# Patient Record
Sex: Male | Born: 1975 | Race: White | Hispanic: No | Marital: Married | Smoking: Never smoker
Health system: Southern US, Community
[De-identification: ages and names within clinical notes are randomized; demographics above are authoritative.]

---

## 2007-09-08 ENCOUNTER — Ambulatory Visit: Payer: Self-pay | Admitting: Unknown Physician Specialty

## 2011-07-04 ENCOUNTER — Emergency Department: Payer: Self-pay | Admitting: Unknown Physician Specialty

## 2012-07-06 ENCOUNTER — Ambulatory Visit: Payer: Self-pay | Admitting: Internal Medicine

## 2014-06-26 IMAGING — CR DG SHOULDER 3+V*L*
1 series · 3 of 3 positions shown · non-contrast
Comparison: none

REASON FOR EXAM: shoulder pain
COMMENTS:

[Series 1: internal rotate · 0.17mm/px · 3 of 3 slices shown]
[im 1/3]
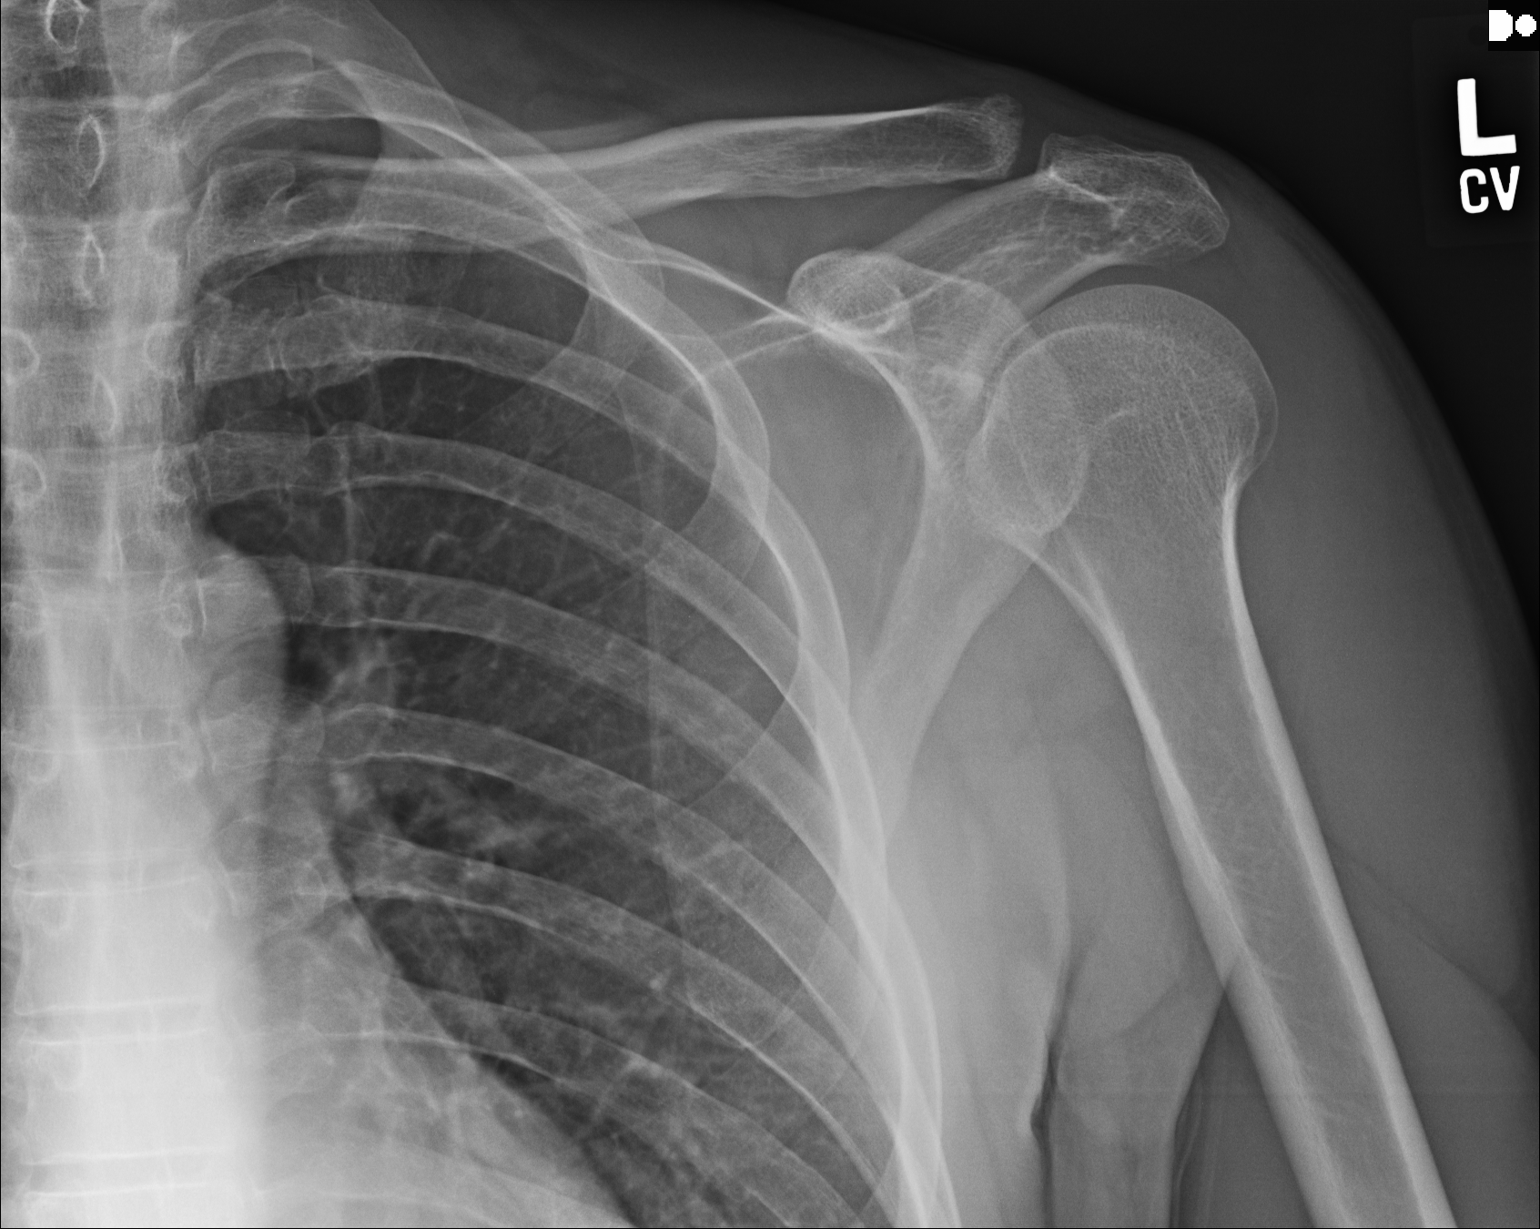
[im 2/3]
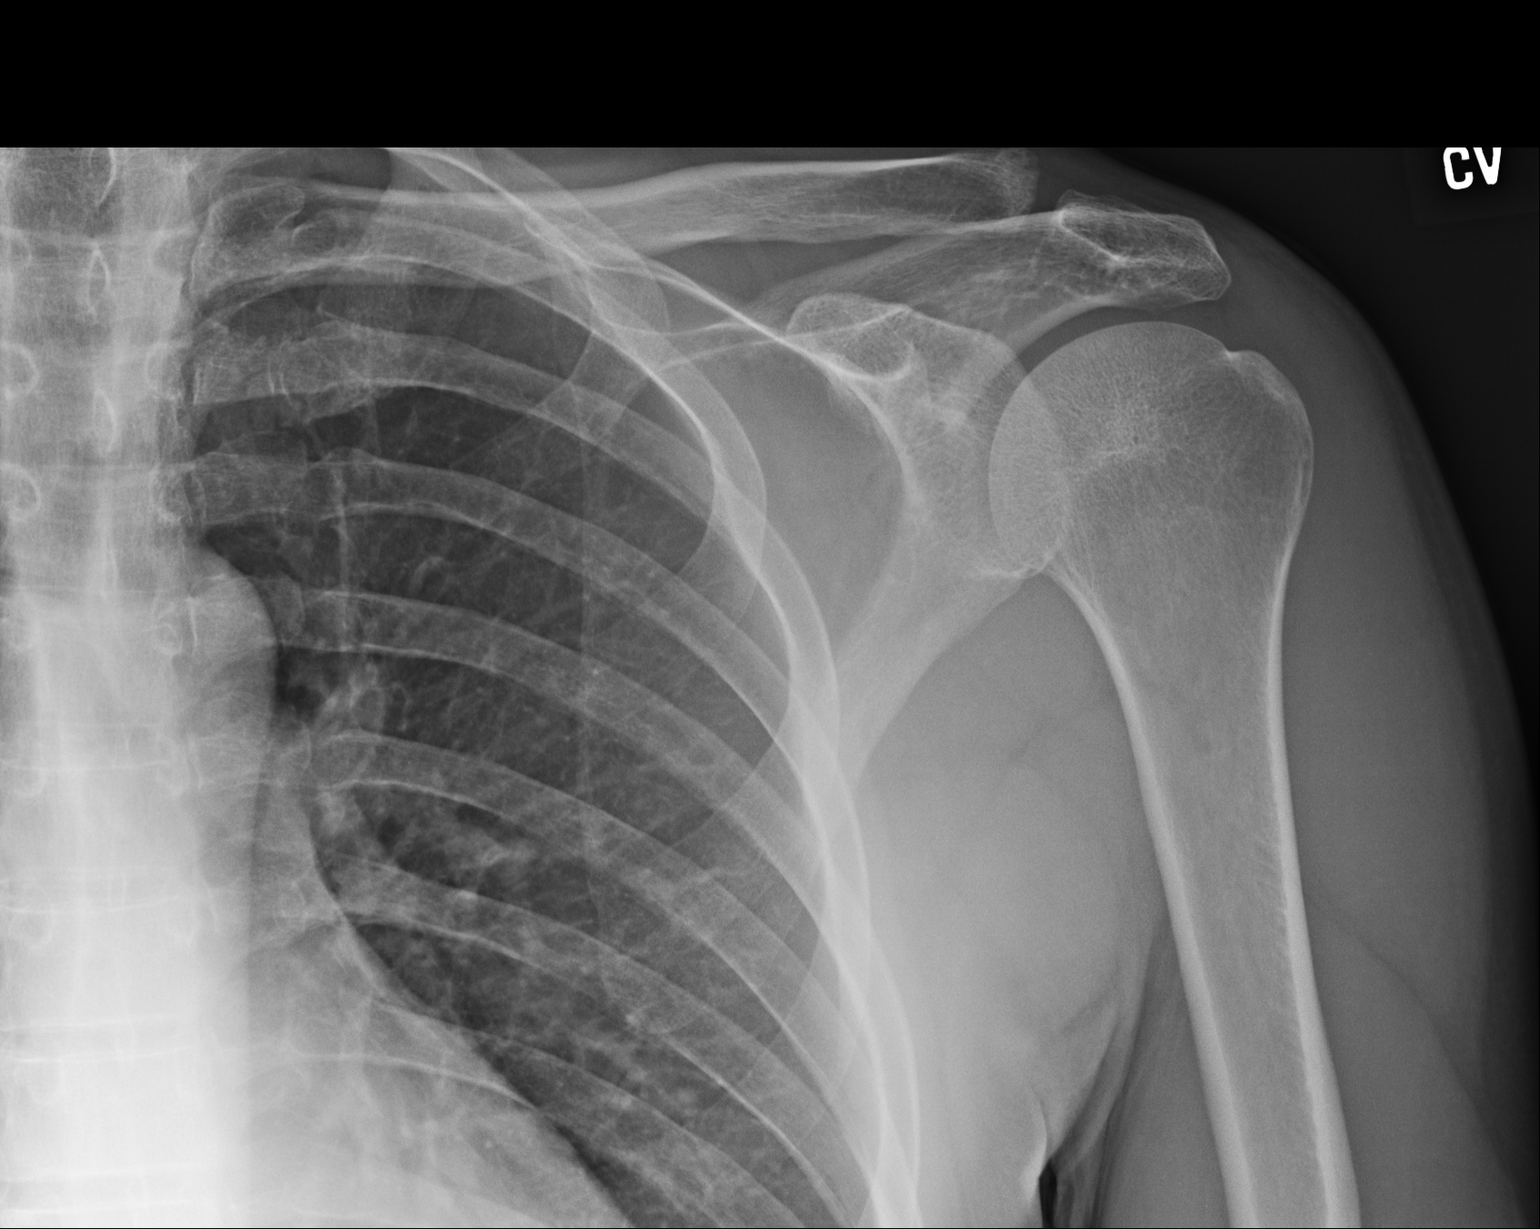
[im 3/3]
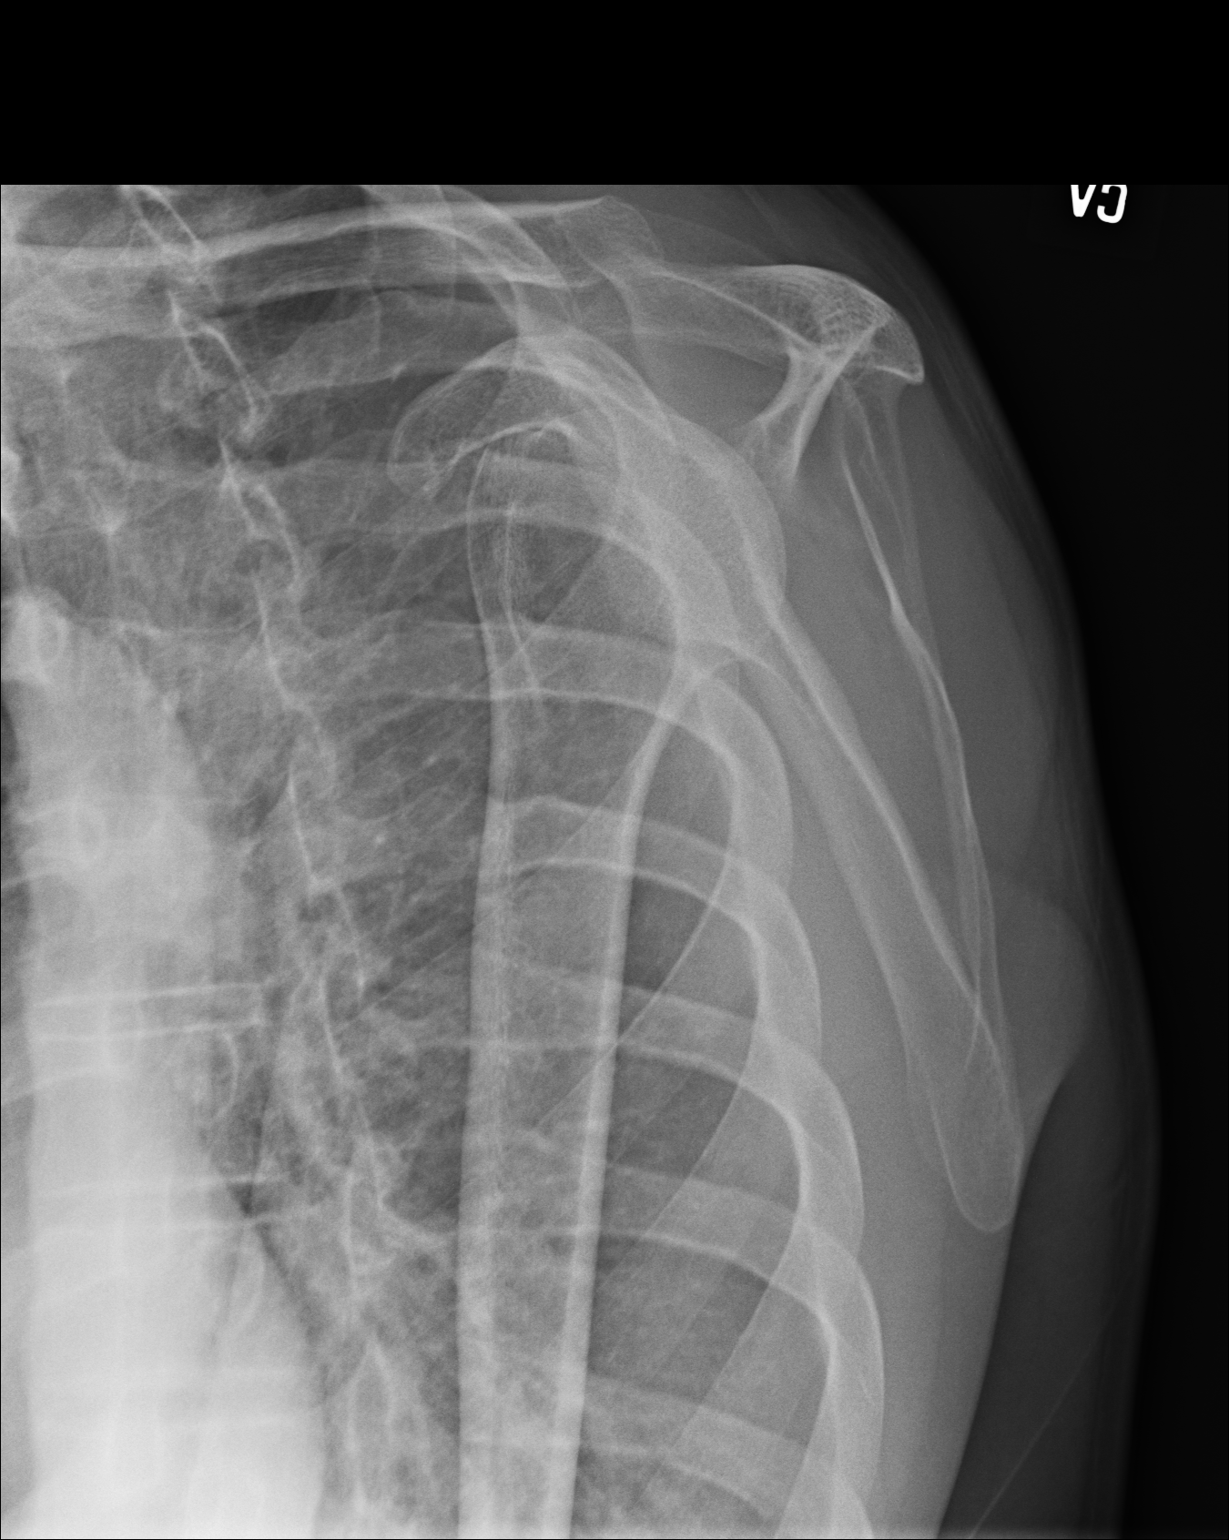

[3 of 3 positions shown; findings below may reference images not displayed]

PROCEDURE:     KDR - KDXR SHOULDER LEFT COMPLETE  - July 06, 2012 [DATE]

RESULT:     Three views of the left shoulder reveal no evidence of an acute
fracture. There are mild degenerative changes of the AC joint. The
glenohumeral joint is grossly normal. The observed portions of the left ribs
appear normal.
IMPRESSION: There is mild degenerative change of the AC joint.
Otherwise the appearance of the shoulders is within the limits of normal.
Further evaluation with MRI may be useful if rotator cuff pathology is
suspected.

[REDACTED]

## 2019-12-14 ENCOUNTER — Other Ambulatory Visit: Payer: Self-pay

## 2019-12-14 ENCOUNTER — Emergency Department
Admission: EM | Admit: 2019-12-14 | Discharge: 2019-12-14 | Disposition: A | Discharge status: Home / Self Care | Payer: BC Managed Care – PPO | Attending: Emergency Medicine | Admitting: Emergency Medicine

## 2019-12-14 DIAGNOSIS — Z23 Encounter for immunization: Secondary | ICD-10-CM | POA: Insufficient documentation

## 2019-12-14 DIAGNOSIS — Y9389 Activity, other specified: Secondary | ICD-10-CM | POA: Diagnosis not present

## 2019-12-14 DIAGNOSIS — Y929 Unspecified place or not applicable: Secondary | ICD-10-CM | POA: Insufficient documentation

## 2019-12-14 DIAGNOSIS — S81811A Laceration without foreign body, right lower leg, initial encounter: Secondary | ICD-10-CM | POA: Insufficient documentation

## 2019-12-14 DIAGNOSIS — W270XXA Contact with workbench tool, initial encounter: Secondary | ICD-10-CM | POA: Insufficient documentation

## 2019-12-14 DIAGNOSIS — Y999 Unspecified external cause status: Secondary | ICD-10-CM | POA: Diagnosis not present

## 2019-12-14 MED ORDER — TETANUS-DIPHTH-ACELL PERTUSSIS 5-2.5-18.5 LF-MCG/0.5 IM SUSP
0.5000 mL | Freq: Once | INTRAMUSCULAR | Status: AC
Start: 2019-12-14 — End: 2019-12-14
  Administered 2019-12-14: 0.5 mL via INTRAMUSCULAR
  Filled 2019-12-14: qty 0.5

## 2019-12-14 MED ORDER — TRAMADOL HCL 50 MG PO TABS: 50.0000 mg | ORAL_TABLET | Freq: Four times a day (QID) | ORAL | 0 refills | Status: AC | PRN

## 2019-12-14 MED ORDER — LIDOCAINE HCL (PF) 1 % IJ SOLN
5.0000 mL | Freq: Once | INTRAMUSCULAR | Status: AC
  Administered 2019-12-14: 5 mL
  Filled 2019-12-14: qty 5

## 2019-12-14 MED ORDER — BACITRACIN-NEOMYCIN-POLYMYXIN 400-5-5000 EX OINT
TOPICAL_OINTMENT | Freq: Once | CUTANEOUS | Status: AC
  Administered 2019-12-14: 1 via TOPICAL
  Filled 2019-12-14: qty 1

## 2019-12-14 MED ORDER — SULFAMETHOXAZOLE-TRIMETHOPRIM 400-80 MG PO TABS: 2.0000 | ORAL_TABLET | Freq: Two times a day (BID) | ORAL | 0 refills | Status: AC

## 2019-12-14 NOTE — ED Provider Notes (Signed)
[facility] Emergency Department Provider Note   ____________________________________________   First MD Initiated Contact with Patient 12/14/19 1843     (approximate)  I have reviewed the triage vital signs and the nursing notes.   HISTORY  Chief Complaint Extremity Laceration    HPI Craig Logan is a 43 y.o. male patient presents with right lower leg laceration.  Patient that he was cutting wood with an ax and slipped hitting the right lower lateral leg.  Patient said bleeding was controlled with direct pressure.  Patient ENT was called and a bandage to the area but he decided to drive by POV to the ED.  Patient denies loss sensation or loss of function.  Patient states tetanus shot not up-to-date.  Patient rates pain as 8/10.  Patient described the pain is "achy".         History reviewed. No pertinent past medical history.  There are no problems to display for this patient.   History reviewed. No pertinent surgical history.  Prior to Admission medications   Medication Sig Start Date End Date Taking? Authorizing Provider  sulfamethoxazole-trimethoprim (BACTRIM) 400-80 MG tablet Take 2 tablets by mouth 2 (two) times daily. 12/14/19   Joni Reining, PA-C  traMADol (ULTRAM) 50 MG tablet Take 1 tablet (50 mg total) by mouth every 6 (six) hours as needed. 12/14/19 12/13/20  Joni Reining, PA-C    Allergies Patient has no known allergies.  No family history on file.  Social History Social History   Tobacco Use  . Smoking status: Never Smoker  . Smokeless tobacco: Never Used  Substance Use Topics  . Alcohol use: Never  . Drug use: Never    Review of Systems Constitutional: No fever/chills Eyes: No visual changes. ENT: No sore throat. Cardiovascular: Denies chest pain. Respiratory: Denies shortness of breath. Gastrointestinal: No abdominal pain.  No nausea, no vomiting.  No diarrhea.  No constipation. Genitourinary: Negative for  dysuria. Musculoskeletal: Negative for back pain. Skin: Negative for rash.  Laceration right lower leg Neurological: Negative for headaches, focal weakness or numbness.   ____________________________________________   PHYSICAL EXAM:  VITAL SIGNS: ED Triage Vitals  Enc Vitals Group     BP 12/14/19 1816 119/70     Pulse Rate 12/14/19 1816 79     Resp 12/14/19 1816 18     Temp 12/14/19 1816 98.6 F (37 C)     Temp Source 12/14/19 1816 Oral     SpO2 12/14/19 1816 97 %     Weight 12/14/19 1814 155 lb (70.3 kg)     Height 12/14/19 1814 5\' 8"  (1.727 m)     Head Circumference --      Peak Flow --      Pain Score 12/14/19 1813 8     Pain Loc --      Pain Edu? --      Excl. in GC? --    Constitutional: Alert and oriented. Well appearing and in no acute distress. Cardiovascular: Normal rate, regular rhythm. Grossly normal heart sounds.  Good peripheral circulation. Respiratory: Normal respiratory effort.  No retractions. Lungs CTAB. Skin: 5 cm laceration right lower leg Psychiatric: Mood and affect are normal. Speech and behavior are normal.  ____________________________________________   LABS (all labs ordered are listed, but only abnormal results are displayed)  Labs Reviewed - No data to display ____________________________________________  EKG   ____________________________________________  RADIOLOGY  ED MD interpretation:    Official radiology report(s): No results found.  ____________________________________________   PROCEDURES  Procedure(s) performed (including Critical Care):  Marland KitchenMarland KitchenLaceration Repair  Date/Time: 12/14/2019 7:25 PM Performed by: Sable Feil, PA-C Authorized by: Sable Feil, PA-C   Consent:    Consent obtained:  Verbal   Consent given by:  Patient   Risks discussed:  Infection, pain, need for additional repair, poor cosmetic result and poor wound healing Anesthesia (see MAR for exact dosages):    Anesthesia method:  Local  infiltration   Local anesthetic:  Lidocaine 1% w/o epi Laceration details:    Location:  Leg   Leg location:  R lower leg   Length (cm):  5   Depth (mm):  2 Repair type:    Repair type:  Simple Pre-procedure details:    Preparation:  Patient was prepped and draped in usual sterile fashion Exploration:    Contaminated: no   Treatment:    Area cleansed with:  Betadine and saline   Amount of cleaning:  Standard   Irrigation method:  Syringe   Visualized foreign bodies/material removed: yes   Skin repair:    Repair method:  Sutures   Suture size:  3-0   Suture material:  Nylon   Suture technique:  Simple interrupted Approximation:    Approximation:  Close Post-procedure details:    Dressing:  Antibiotic ointment and sterile dressing   Patient tolerance of procedure:  Tolerated well, no immediate complications     ____________________________________________   INITIAL IMPRESSION / ASSESSMENT AND PLAN / ED COURSE  As part of my medical decision making, I reviewed the following data within the [place]     Patient presents with a laceration right lower leg.  See procedure note for wound closure.  Patient given discharge care instruction advised take medication as directed.  Patient vies return back in 10 days for suture removal.    Craig Logan was evaluated in Emergency Department on 12/14/2019 for the symptoms described in the history of present illness. He was evaluated in the context of the global COVID-19 pandemic, which necessitated consideration that the patient might be at risk for infection with the SARS-CoV-2 virus that causes COVID-19. Institutional protocols and algorithms that pertain to the evaluation of patients at risk for COVID-19 are in a state of rapid change based on information released by regulatory bodies including the CDC and federal and state organizations. These policies and algorithms were followed during the patient's care in the  ED.       ____________________________________________   FINAL CLINICAL IMPRESSION(S) / ED DIAGNOSES  Final diagnoses:  Laceration of right lower extremity, initial encounter     ED Discharge Orders         Ordered    traMADol (ULTRAM) 50 MG tablet  Every 6 hours PRN     12/14/19 1922    sulfamethoxazole-trimethoprim (BACTRIM) 400-80 MG tablet  2 times daily     12/14/19 1923           Note:  This document was prepared using Dragon voice recognition software and may include unintentional dictation errors.    Sable Feil, PA-C 12/14/19 Durward Fortes, MD 12/14/19 2252

## 2019-12-14 NOTE — ED Triage Notes (Signed)
Pt states that he was cutting wood with ax and slipped hitting right outer ankle region - EMS was called and bandaged area and then pt drove self to ED

## 2019-12-14 NOTE — Discharge Instructions (Signed)
Follow discharge care instructions and take pain medication as needed.  Abscesses removed in 10 days.
